# Patient Record
Sex: Female | Born: 2000 | Race: Black or African American | Hispanic: No | Marital: Single | Smoking: Never smoker
Health system: Southern US, Community
[De-identification: ages and names within clinical notes are randomized; demographics above are authoritative.]

## PROBLEM LIST (undated history)

## (undated) DIAGNOSIS — F410 Panic disorder [episodic paroxysmal anxiety] without agoraphobia: Secondary | ICD-10-CM

---

## 2019-07-11 ENCOUNTER — Other Ambulatory Visit: Payer: Self-pay

## 2019-07-11 ENCOUNTER — Emergency Department (HOSPITAL_COMMUNITY)
Admission: EM | Admit: 2019-07-11 | Discharge: 2019-07-11 | Disposition: A | Discharge status: Home / Self Care | Payer: BC Managed Care – PPO | Attending: Emergency Medicine | Admitting: Emergency Medicine

## 2019-07-11 DIAGNOSIS — R519 Headache, unspecified: Secondary | ICD-10-CM | POA: Insufficient documentation

## 2019-07-11 DIAGNOSIS — Z5321 Procedure and treatment not carried out due to patient leaving prior to being seen by health care provider: Secondary | ICD-10-CM | POA: Diagnosis not present

## 2019-07-11 DIAGNOSIS — R0602 Shortness of breath: Secondary | ICD-10-CM | POA: Insufficient documentation
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
## 2020-06-17 ENCOUNTER — Emergency Department (HOSPITAL_COMMUNITY): Payer: BC Managed Care – PPO

## 2020-06-17 ENCOUNTER — Other Ambulatory Visit: Payer: Self-pay

## 2020-06-17 ENCOUNTER — Encounter (HOSPITAL_COMMUNITY): Payer: Self-pay | Admitting: Emergency Medicine

## 2020-06-17 ENCOUNTER — Emergency Department (HOSPITAL_COMMUNITY)
Admission: EM | Admit: 2020-06-17 | Discharge: 2020-06-17 | Disposition: A | Discharge status: Home / Self Care | Payer: BC Managed Care – PPO | Attending: Emergency Medicine | Admitting: Emergency Medicine

## 2020-06-17 DIAGNOSIS — S069X1A Unspecified intracranial injury with loss of consciousness of 30 minutes or less, initial encounter: Secondary | ICD-10-CM | POA: Insufficient documentation

## 2020-06-17 DIAGNOSIS — R110 Nausea: Secondary | ICD-10-CM | POA: Insufficient documentation

## 2020-06-17 DIAGNOSIS — S0990XA Unspecified injury of head, initial encounter: Secondary | ICD-10-CM | POA: Diagnosis present

## 2020-06-17 DIAGNOSIS — M25512 Pain in left shoulder: Secondary | ICD-10-CM | POA: Diagnosis not present

## 2020-06-17 DIAGNOSIS — Y92410 Unspecified street and highway as the place of occurrence of the external cause: Secondary | ICD-10-CM | POA: Insufficient documentation

## 2020-06-17 DIAGNOSIS — S060X1A Concussion with loss of consciousness of 30 minutes or less, initial encounter: Secondary | ICD-10-CM

## 2020-06-17 DIAGNOSIS — Q282 Arteriovenous malformation of cerebral vessels: Secondary | ICD-10-CM

## 2020-06-17 HISTORY — DX: Panic disorder (episodic paroxysmal anxiety): F41.0

## 2020-06-17 LAB — BASIC METABOLIC PANEL
Anion gap: 8 (ref 5–15)
BUN: 9 mg/dL (ref 6–20)
CO2: 21 mmol/L — ABNORMAL LOW (ref 22–32)
Calcium: 10.2 mg/dL (ref 8.9–10.3)
Chloride: 106 mmol/L (ref 98–111)
Creatinine, Ser: 0.78 mg/dL (ref 0.44–1.00)
GFR, Estimated: >60 mL/min (ref >60)
Glucose, Bld: 103 mg/dL — ABNORMAL HIGH (ref 70–99)
Potassium: 4.3 mmol/L (ref 3.5–5.1)
Sodium: 135 mmol/L (ref 135–145)

## 2020-06-17 LAB — CBC
HCT: 38.4 % (ref 36.0–46.0)
Hemoglobin: 12.3 g/dL (ref 12.0–15.0)
MCH: 24.2 pg — ABNORMAL LOW (ref 26.0–34.0)
MCHC: 32 g/dL (ref 30.0–36.0)
MCV: 75.4 fL — ABNORMAL LOW (ref 80.0–100.0)
Platelets: 317 10*3/uL (ref 150–400)
RBC: 5.09 MIL/uL (ref 3.87–5.11)
RDW: 14.1 % (ref 11.5–15.5)
WBC: 7 10*3/uL (ref 4.0–10.5)
nRBC: 0 % (ref 0.0–0.2)

## 2020-06-17 LAB — I-STAT BETA HCG BLOOD, ED (MC, WL, AP ONLY): I-stat hCG, quantitative: <5 m[IU]/mL (ref <5)

## 2020-06-17 MED ORDER — FENTANYL CITRATE (PF) 100 MCG/2ML IJ SOLN
50.0000 ug | Freq: Once | INTRAMUSCULAR | Status: AC
  Administered 2020-06-17: 50 ug via INTRAVENOUS
  Filled 2020-06-17: qty 2

## 2020-06-17 MED ORDER — DIPHENHYDRAMINE HCL 50 MG/ML IJ SOLN: 25.0000 mg | Freq: Once | INTRAMUSCULAR | Status: DC

## 2020-06-17 MED ORDER — ACETAMINOPHEN 325 MG PO TABS: 650.0000 mg | ORAL_TABLET | Freq: Once | ORAL | Status: DC

## 2020-06-17 MED ORDER — METOCLOPRAMIDE HCL 10 MG PO TABS: 10.0000 mg | ORAL_TABLET | Freq: Four times a day (QID) | ORAL | 0 refills | Status: AC | PRN

## 2020-06-17 MED ORDER — METOCLOPRAMIDE HCL 5 MG/ML IJ SOLN
10.0000 mg | Freq: Once | INTRAMUSCULAR | Status: DC
Start: 2020-06-17 — End: 2020-06-17

## 2020-06-17 MED ORDER — IOHEXOL 350 MG/ML SOLN
75.0000 mL | Freq: Once | INTRAVENOUS | Status: AC | PRN
  Administered 2020-06-17: 75 mL via INTRAVENOUS

## 2020-06-17 MED ORDER — ONDANSETRON HCL 4 MG/2ML IJ SOLN
4.0000 mg | Freq: Once | INTRAMUSCULAR | Status: AC
  Administered 2020-06-17: 4 mg via INTRAVENOUS
  Filled 2020-06-17: qty 2

## 2020-06-17 MED ORDER — SODIUM CHLORIDE 0.9 % IV BOLUS
1000.0000 mL | Freq: Once | INTRAVENOUS | Status: AC
  Administered 2020-06-17: 1000 mL via INTRAVENOUS

## 2020-06-17 MED ORDER — ACETAMINOPHEN 325 MG PO TABS
650.0000 mg | ORAL_TABLET | Freq: Once | ORAL | Status: AC
  Administered 2020-06-17: 650 mg via ORAL
  Filled 2020-06-17: qty 2

## 2020-06-17 NOTE — ED Notes (Signed)
Reviewed discharge instructions with patient and mother. Follow-up care and medications reviewed. Patient and mother verbalized understanding. Patient A&Ox4, VSS upon discharge.

## 2020-06-17 NOTE — Discharge Instructions (Signed)
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  You likely have a concussion and that can last for several days to weeks.   Rest for several days.  Take Tylenol for headaches.  Take Reglan for worse headaches or nausea  You may need to talk to your primary care doctor and will be referred to a concussion clinic  Return to ER if you have worse headaches, vomiting, dizziness, double vision.
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
## 2020-06-17 NOTE — ED Notes (Signed)
Family wishes to hold benadryl and reglan at this time d/t potential for pt to become more drowsy than she already is.

## 2020-06-18 LAB — CBG MONITORING, ED: Glucose-Capillary: 90 mg/dL (ref 70–99)

## 2021-05-01 ENCOUNTER — Encounter (HOSPITAL_COMMUNITY): Payer: Self-pay

## 2021-05-01 ENCOUNTER — Other Ambulatory Visit: Payer: Self-pay

## 2021-05-01 ENCOUNTER — Ambulatory Visit (HOSPITAL_COMMUNITY)
Admission: EM | Admit: 2021-05-01 | Discharge: 2021-05-01 | Disposition: A | Discharge status: Home / Self Care | Payer: BC Managed Care – PPO | Attending: Physician Assistant | Admitting: Physician Assistant

## 2021-05-01 ENCOUNTER — Ambulatory Visit: Admit: 2021-05-01 | Disposition: A | Discharge status: Other Acute Inpatient Hospital | Payer: BC Managed Care – PPO

## 2021-05-01 DIAGNOSIS — R519 Headache, unspecified: Secondary | ICD-10-CM

## 2021-05-01 MED ORDER — CYCLOBENZAPRINE HCL 5 MG PO TABS: 5.0000 mg | ORAL_TABLET | Freq: Two times a day (BID) | ORAL | 0 refills | Status: AC | PRN

## 2021-05-01 NOTE — ED Triage Notes (Signed)
Pt presents for headache/ migraine for several days.

## 2021-05-01 NOTE — Discharge Instructions (Signed)
Your physical exam was reassuring today.  Please take Flexeril up to twice a day as needed for headache pain.  This will make you sleepy so do not drive or drink alcohol with taking it.  You can use over-the-counter medications such as Tylenol for additional symptom relief.  I do recommend that you follow-up with either our clinic or [facility] next week for recheck.  If at any point anything worsens or if your symptoms or not improving you need to go to the emergency room as we discussed.  If you have weakness especially 1 part of your body, nausea/vomiting, vision change, worsening headache pain, the worst headache of your life, dizziness, passing out you need to go directly to the emergency room as we discussed.
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
